# Patient Record
Sex: Male | Born: 1995 | Hispanic: No | Marital: Single
Health system: Western US, Academic
[De-identification: ages and names within clinical notes are randomized; demographics above are authoritative.]

## PROBLEM LIST (undated history)

## (undated) DIAGNOSIS — F319 Bipolar disorder, unspecified: Secondary | ICD-10-CM

## (undated) DIAGNOSIS — F609 Personality disorder, unspecified: Secondary | ICD-10-CM

---
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
## 2013-01-14 ENCOUNTER — Emergency Department: Payer: Self-pay | Admitting: Emergency Medicine

## 2013-02-06 ENCOUNTER — Emergency Department: Payer: Self-pay | Admitting: Emergency Medicine

## 2013-02-12 ENCOUNTER — Emergency Department: Payer: Self-pay | Admitting: Emergency Medicine

## 2013-03-20 ENCOUNTER — Emergency Department: Payer: Self-pay | Admitting: Emergency Medicine

## 2013-08-13 ENCOUNTER — Encounter (HOSPITAL_COMMUNITY): Payer: Self-pay | Admitting: Emergency Medicine

## 2013-08-13 ENCOUNTER — Emergency Department: Payer: Self-pay | Admitting: Emergency Medicine

## 2013-08-13 ENCOUNTER — Emergency Department (HOSPITAL_COMMUNITY)
Admission: EM | Admit: 2013-08-13 | Discharge: 2013-08-14 | Disposition: A | Discharge status: Home / Self Care | Payer: BC Managed Care – PPO | Attending: Emergency Medicine | Admitting: Emergency Medicine

## 2013-08-13 DIAGNOSIS — R55 Syncope and collapse: Secondary | ICD-10-CM | POA: Insufficient documentation

## 2013-08-13 DIAGNOSIS — F17200 Nicotine dependence, unspecified, uncomplicated: Secondary | ICD-10-CM | POA: Insufficient documentation

## 2013-08-13 DIAGNOSIS — F319 Bipolar disorder, unspecified: Secondary | ICD-10-CM | POA: Insufficient documentation

## 2013-08-13 DIAGNOSIS — R5383 Other fatigue: Secondary | ICD-10-CM

## 2013-08-13 DIAGNOSIS — R5381 Other malaise: Secondary | ICD-10-CM | POA: Insufficient documentation

## 2013-08-13 HISTORY — DX: Bipolar disorder, unspecified: F31.9

## 2013-08-13 HISTORY — DX: Personality disorder, unspecified: F60.9

## 2013-08-13 LAB — CBC
HCT: 47 % (ref 40.0–52.0)
HGB: 16.4 g/dL (ref 13.0–18.0)
MCH: 30.4 pg (ref 26.0–34.0)
MCHC: 34.8 g/dL (ref 32.0–36.0)
MCV: 87 fL (ref 80–100)
PLATELETS: 316 10*3/uL (ref 150–440)
RBC: 5.39 10*6/uL (ref 4.40–5.90)
RDW: 13.4 % (ref 11.5–14.5)
WBC: 10.4 10*3/uL (ref 3.8–10.6)

## 2013-08-13 LAB — ETHANOL
Alcohol, Ethyl (B): <11 mg/dL (ref 0–11)
Ethanol %: <0.003 % (ref 0.000–0.080)
Ethanol: <3 mg/dL

## 2013-08-13 LAB — DRUG SCREEN, URINE

## 2013-08-13 LAB — CBC WITH DIFFERENTIAL/PLATELET
Basophils Absolute: 0.1 10*3/uL (ref 0.0–0.1)
Basophils Relative: 1 % (ref 0–1)
Eosinophils Absolute: 0.1 10*3/uL (ref 0.0–0.7)
Eosinophils Relative: 1 % (ref 0–5)
HCT: 41 % (ref 39.0–52.0)
Hemoglobin: 14.6 g/dL (ref 13.0–17.0)
LYMPHS ABS: 1.5 10*3/uL (ref 0.7–4.0)
LYMPHS PCT: 13 % (ref 12–46)
MCH: 30.2 pg (ref 26.0–34.0)
MCHC: 35.6 g/dL (ref 30.0–36.0)
MCV: 84.9 fL (ref 78.0–100.0)
Monocytes Absolute: 1.1 10*3/uL — ABNORMAL HIGH (ref 0.1–1.0)
Monocytes Relative: 10 % (ref 3–12)
NEUTROS ABS: 8.4 10*3/uL — AB (ref 1.7–7.7)
Neutrophils Relative %: 76 % (ref 43–77)
PLATELETS: 281 10*3/uL (ref 150–400)
RBC: 4.83 MIL/uL (ref 4.22–5.81)
RDW: 12.6 % (ref 11.5–15.5)
WBC: 11.1 10*3/uL — AB (ref 4.0–10.5)

## 2013-08-13 LAB — RAPID URINE DRUG SCREEN, HOSP PERFORMED
Amphetamines: NOT DETECTED
BARBITURATES: NOT DETECTED
BENZODIAZEPINES: NOT DETECTED
Cocaine: NOT DETECTED
Opiates: NOT DETECTED
Tetrahydrocannabinol: NOT DETECTED

## 2013-08-13 LAB — COMPREHENSIVE METABOLIC PANEL
ALBUMIN: 4.3 g/dL (ref 3.8–5.6)
ALK PHOS: 71 U/L
AST: 30 U/L (ref 10–41)
Anion Gap: 7 (ref 7–16)
BILIRUBIN TOTAL: 0.6 mg/dL (ref 0.2–1.0)
BUN: 18 mg/dL (ref 7–18)
Calcium, Total: 9.3 mg/dL (ref 9.0–10.7)
Chloride: 106 mmol/L (ref 98–107)
Co2: 26 mmol/L (ref 21–32)
Creatinine: 0.94 mg/dL (ref 0.60–1.30)
EGFR (Non-African Amer.): >60
Glucose: 102 mg/dL — ABNORMAL HIGH (ref 65–99)
Osmolality: 280 (ref 275–301)
Potassium: 3.8 mmol/L (ref 3.5–5.1)
SGPT (ALT): 21 U/L (ref 12–78)
Sodium: 139 mmol/L (ref 136–145)
TOTAL PROTEIN: 7.8 g/dL (ref 6.4–8.6)

## 2013-08-13 LAB — URINALYSIS, COMPLETE
BACTERIA: NONE SEEN
BILIRUBIN, UR: NEGATIVE
Glucose,UR: NEGATIVE mg/dL (ref 0–75)
KETONE: NEGATIVE
NITRITE: NEGATIVE
Ph: 5 (ref 4.5–8.0)
Protein: NEGATIVE
RBC,UR: 3 /HPF (ref 0–5)
Specific Gravity: 1.023 (ref 1.003–1.030)

## 2013-08-13 LAB — BASIC METABOLIC PANEL
BUN: 18 mg/dL (ref 6–23)
CO2: 26 meq/L (ref 19–32)
Calcium: 9.2 mg/dL (ref 8.4–10.5)
Chloride: 100 mEq/L (ref 96–112)
Creatinine, Ser: 1.01 mg/dL (ref 0.50–1.35)
GFR calc Af Amer: >90 mL/min (ref >90)
GFR calc non Af Amer: >90 mL/min (ref >90)
GLUCOSE: 95 mg/dL (ref 70–99)
Potassium: 3.7 mEq/L (ref 3.7–5.3)
Sodium: 138 mEq/L (ref 137–147)

## 2013-08-13 LAB — TROPONIN I: Troponin-I: <0.02 ng/mL

## 2013-08-13 LAB — LIPASE, BLOOD: Lipase: 97 U/L (ref 73–393)

## 2013-08-13 NOTE — ED Notes (Addendum)
Joanie CoddingtonLatricia, RN given report

## 2013-08-13 NOTE — ED Provider Notes (Signed)
CSN: [id number]     Arrival date & time 08/13/13  1613 History   First MD Initiated Contact with Patient 08/13/13 2207     Chief Complaint  Patient presents with  . Medical Clearance   (Consider location/radiation/quality/duration/timing/severity/associated sxs/prior Treatment) HPI Comments: 18 year old male presents to the ER seeking medicines for his bipolar. The patient states that 2 years ago he stopped taking Seroquel and other unknown medicine that he is taking for bipolar and has anger issues. The patient states that he feels like he needs these medicines back because she's having a lot of family issues "a dark time". The patient states that he feels that this would help him calm down. The patient has not had any outbursts recently. He denies any SI or HI. The patient also recently came off alcohol and drugs 2 months ago. Patient states that he has a psychiatric clinic in his hometown that he feels like he can followup with tomorrow but wants to get started on some medicines. The patient recently was in the ER in [place] last night as he had a syncopal episode after smoking. The patient denies any chest pain or shortness of breath. He states he feels normal at this time. He states he was given IV fluids but is not otherwise known as results and states they did nothing for her and sent him home. He did not bring up the issues of his meds but the ER doctor at [facility]   Past Medical History  Diagnosis Date  . Bipolar 1 disorder   . Personality disorder    History reviewed. No pertinent past surgical history. No family history on file. History  Substance Use Topics  . Smoking status: Current Every Day Smoker -- 1.00 packs/day    Types: Cigarettes  . Smokeless tobacco: Not on file  . Alcohol Use: No    Review of Systems  Constitutional: Positive for fatigue.  Respiratory: Negative for cough and shortness of breath.   Cardiovascular: Negative for chest pain.  Gastrointestinal:  Negative for vomiting and abdominal pain.  Neurological: Positive for syncope. Negative for weakness.  Psychiatric/Behavioral: Negative for suicidal ideas, self-injury and dysphoric mood.  All other systems reviewed and are negative.    Allergies  Risperdal  Home Medications   Current Outpatient Rx  Name  Route  Sig  Dispense  Refill  . ibuprofen (ADVIL,MOTRIN) 200 MG tablet   Oral   Take 200 mg by mouth every 6 (six) hours as needed for mild pain.          BP 151/80  Pulse 77  Temp(Src) 97.6 F (36.4 C) (Oral)  Resp 18  SpO2 99% Physical Exam  Nursing note and vitals reviewed. Constitutional: He is oriented to person, place, and time. He appears well-developed and well-nourished.  HENT:  Head: Normocephalic and atraumatic.  Right Ear: External ear normal.  Left Ear: External ear normal.  Nose: Nose normal.  Eyes: Right eye exhibits no discharge. Left eye exhibits no discharge.  Neck: Neck supple.  Cardiovascular: Normal rate, regular rhythm, normal heart sounds and intact distal pulses.   No murmur heard. Pulmonary/Chest: Effort normal.  Abdominal: Soft. He exhibits no distension. There is no tenderness.  Musculoskeletal: He exhibits no edema.  Neurological: He is alert and oriented to person, place, and time.  Skin: Skin is warm and dry.    ED Course  Procedures (including critical care time) Labs Review Labs Reviewed  CBC WITH DIFFERENTIAL - Abnormal; Notable for the following:  WBC 11.1 (*)    Neutro Abs 8.4 (*)    Monocytes Absolute 1.1 (*)    All other components within normal limits  BASIC METABOLIC PANEL  URINE RAPID DRUG SCREEN (HOSP PERFORMED)  ETHANOL   Imaging Review No results found.  EKG Interpretation   None       MDM   1. Bipolar 1 disorder    Patient with psych complaints but no acute indication for admission (no SI/HI or psychosis). Psych consulted for med recs. As for his syncope, tried to get records from [place] but  unable. No complaints today related to syncope, feel he can f/u as outpatient for this. Care transferred with psych recs pending.     Audree Camel, MD 08/14/13 [phone]

## 2013-08-13 NOTE — ED Notes (Signed)
Aunt called stating pt having flight of thoughts; has had several anger outbursts recently; moved in with aunt in November 2014 to avoid him having to go to jail; states are small children in the home that are scared of the patient

## 2013-08-13 NOTE — ED Notes (Signed)
Family member Jearld LeschKatherine Lolling, [phone], reports pt is out on bond, stating if he is released the Police Dept needs to be notified.

## 2013-08-13 NOTE — ED Notes (Signed)
Pt presents from triage, transferred from medical clearance.  Pt experiencing anger issues at home.  Pt states he became so angry he couldn't cope or contain his anger.  Pt states he wants to resume his meds, has been off for past 2 years.  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  Pt reports he has a history of Bipolar, does not remember the names of meds he was taking.   Denies SI, HI or AV hallucinations.  Pt calm & cooperative, interactive, pleasant at present.

## 2013-08-13 NOTE — Progress Notes (Signed)
   CARE MANAGEMENT ED NOTE 08/13/2013  Patient:  James Austin,James Austin   Account Number:  [id number]  Date Initiated:  08/13/2013  Documentation initiated by:  Radford PaxFERRERO,Saif Peter  Subjective/Objective Assessment:   Patient presents to Ed with issues at home and could not control his anger     Subjective/Objective Assessment Detail:   Patient was seen at [facility] with same.     Action/Plan:   Action/Plan Detail:   Anticipated DC Date:       Status Recommendation to Physician:   Result of Recommendation:    Other ED Services  Consult Working Plan    DC Planning Services  Other  PCP issues    Choice offered to / List presented to:            Status of service:  Completed, signed off  ED Comments:   ED Comments Detail:  EDCM spoke to patient at bedside.  Patient reports he does not have a pcp but does have [employer] insurance.  EDCM provided patient with list of pcps who accept BC[employer] insurance within a ten mile radius of patient's zip code.  Patient thankful for resources.

## 2013-08-13 NOTE — ED Notes (Signed)
Pt states is bipolar and has trouble with anger issues; was seen at [facility] last night for same; states wants to be put back on meds; off meds x 2 years; last couple of months has felt worse with incidents with a young child in the home

## 2013-08-13 NOTE — ED Notes (Signed)
Pt;s dad took belonging with him

## 2013-08-14 DIAGNOSIS — F319 Bipolar disorder, unspecified: Secondary | ICD-10-CM

## 2013-08-14 MED ORDER — QUETIAPINE FUMARATE 50 MG PO TABS: 50.0000 mg | ORAL_TABLET | Freq: Every day | ORAL | Status: AC

## 2013-08-14 MED ORDER — BUSPIRONE HCL 10 MG PO TABS: 10.0000 mg | ORAL_TABLET | Freq: Two times a day (BID) | ORAL | Status: AC

## 2013-08-14 NOTE — Discharge Instructions (Signed)
°Emergency Department Resource Guide °1) Find a Doctor and Pay Out of Pocket °Although you won't have to find out who is covered by your insurance plan, it is a good idea to ask around and get recommendations. You will then need to call the office and see if the doctor you have chosen will accept you as a new patient and what types of options they offer for patients who are self-pay. Some doctors offer discounts or will set up payment plans for their patients who do not have insurance, but you will need to ask so you aren't surprised when you get to your appointment. ° °2) Contact Your Local Health Department °Not all health departments have doctors that can see patients for sick visits, but many do, so it is worth a call to see if yours does. If you don't know where your local health department is, you can check in your phone book. The CDC also has a tool to help you locate your state's health department, and many state websites also have listings of all of their local health departments. ° °3) Find a Walk-in Clinic °If your illness is not likely to be very severe or complicated, you may want to try a walk in clinic. These are popping up all over the country in pharmacies, drugstores, and shopping centers. They're usually staffed by nurse practitioners or physician assistants that have been trained to treat common illnesses and complaints. They're usually fairly quick and inexpensive. However, if you have serious medical issues or chronic medical problems, these are probably not your best option. ° °No Primary Care Doctor: °- Call Health Connect at  832-8000 - they can help you locate a primary care doctor that  accepts your insurance, provides certain services, etc. °- Physician Referral Service- 1-800-533-3463 ° °Chronic Pain Problems: °Organization         Address  Phone   Notes  °[facility]  (336) 297-2271 Patients need to be referred by their primary care doctor.  ° °Medication  Assistance: °Organization         Address  Phone   Notes  °[place] [facility] [address] °[place], [place] [address] (336) 641-8030 --Must be a resident of [place] °-- Must have NO insurance coverage whatsoever (no Medicaid/ Medicare, etc.) °-- The pt. MUST have a primary care doctor that directs their care regularly and follows them in the community °  °[facility]  (866) 331-1348   °[employer]  (888) 892-1162   ° °Agencies that provide inexpensive medical care: °Organization         Address  Phone   Notes  °[facility]  (336) 832-8035   °[facility]    (336) 832-7272   °[facility] [address] °[place], [place] [address] (336) 832-4777   °[facility] [address], °[place] (336) 271-4999   °[facility]    (336) 373-0678   °[facility]    (336) 272-1050   °[facility] ° [address], [place] Phone:  (336) 832-4444, Fax:  (336) 832-4440 Hours of Operation:  9 am - 6 pm, M-F.  Also accepts Medicaid/Medicare and self-pay.  °[facility] ° [address], [place] Phone: (336) 832-3150, Fax: (336) 832-3151. Hours of Operation:  8:30 am - 5:30 pm, M-F.  Also accepts Medicaid and self-pay.  °[facility] [place] [address], [place] Phone: (336) 878-6027   °[facility] [address], [place], [place] (336)723-1848, Ext. 123 Mondays & Thursdays: 7-9 AM.  First 15 patients are seen on a first come, first serve basis. °  ° °Medicaid-accepting [place] Providers: ° °Organization         Address  Phone   Notes  °[facility] [address], [place] (336) 641-2100 Also accepts self-pay patients.  °[facility] [address], [place] ° (336) 856-9996   °[facility] [address], [place]  (336) 288-8857   °[facility] [address] [place] [address], [place] (336) 299-7000   °Veita Bland [address], [place]  ° (336) 373-1557 Only accepts [place] Access Medicaid patients after they have their name applied to their card.  ° °Self-Pay (no insurance) in [place]: ° °Organization         Address  Phone   Notes  °Sickle Cell Patients, [facility] [address], [place] (336) 832-1970   °[facility] [address], [place] (336) 832-4400   ° [facility] ° [address], [place] (336) 992-4800   °[facility]/Dr. Osei-Bonsu ° [address] [place] [address], [place] or [address], [place] (336) 841-8500 Phone number for both [place] and [place] locations is the same.  °[facility] [address], [place] (336) 299-0000   °[facility] [address] [place] [address], [place] or [address] (336) 852-7530 °(336) 878-2260   °[facility] [address], [place] (336) 350-1642, phone; (336) 294-5005, fax Sees patients 1st and 3rd Saturday of every month.  Must not qualify for public or private insurance (i.e. Medicaid, Medicare, [place] Health Choice, Veterans' Benefits) • Household income should be no more than 200% of the poverty level •The clinic cannot treat you if you are pregnant or think you are pregnant • Sexually transmitted diseases are not treated at the clinic.  ° ° °Dental Care: °Organization         Address  Phone  Notes  °[place] [facility] [address], [place] (336) 641-6152 Accepts children up to age 21 who are enrolled in Medicaid or [place] Health Choice; pregnant women with a Medicaid card; and children who have applied for Medicaid or [place] Health Choice, but were declined, whose parents can pay a reduced fee at time of service.  °[place]  [facility] [place]  [address], [place] (336) 641-7733 Accepts children up to age 21 who are enrolled in Medicaid or [place] Health Choice; pregnant women with a Medicaid card; and children who have applied for Medicaid or [place] Health Choice, but were declined, whose parents can pay a reduced fee at time of service.  °[facility] ° [address], [place] (336) 641-4533 Patients are seen by appointment only. Walk-ins are not accepted. [facility] will see patients 18 years of age and older. °Monday - Tuesday (8am-5pm) °Most Wednesdays (8:30-5pm) °$30 per visit, cash only  °[facility] ° [address], [place] (336) 641-4533 Patients are seen by appointment only. Walk-ins are not accepted. [facility] will see patients 18 years of age and older. °One   Wednesday Evening (Monthly: Volunteer Based).  $30 per visit, cash only  °[facility]  (919) 537-3737 for adults; Children under age 4, call Graduate Pediatric Dentistry at (919) [phone]. Children aged 4-14, please call (919) 537-3737 to request a pediatric application. ° Dental services are provided in all areas of dental care including fillings, crowns and bridges, complete and partial dentures, implants, gum treatment, root canals, and extractions. Preventive care is also provided. Treatment is provided to both adults and children. °Patients are selected via a lottery and there is often a waiting list. °  °[facility] [address], °[place] ° (336) 763-8833 [email or web address] °  °[facility] [address], [place], [place] (336)723-1848, Ext. 123 Second and Fourth Thursday of each month, opens at 6:30 AM; Clinic ends at 9 AM.  Patients are seen on a first-come first-served basis, and a limited number are seen during each clinic.  ° °[facility] ° [address], [place], [place] (336) 723-7904    Eligibility Requirements °You must have lived in [place], [place], or [place] counties for at least the last three months. °  You cannot be eligible for state or federal sponsored healthcare insurance, including Veterans Administration, Medicaid, or Medicare. °  You generally cannot be eligible for healthcare insurance through your employer.  °  How to apply: °Eligibility screenings are held every Tuesday and Wednesday afternoon from 1:00 pm until 4:00 pm. You do not need an appointment for the interview!  °[facility] [address], [place], [place] 336-631-2330   °[place] [facility]  336-342-8273   °[place]  336-703-3100   °[facility]  336-570-6415   ° °Behavioral Health Resources in the Community: °Intensive Outpatient Programs °Organization         Address  Phone  Notes  °[facility] [address], [place], [place] 336-878-6098   °[facility] [address], [place], [place] 336-832-9800   °[facility] [address], [place], [place] ° 336-882-2125   °[facility] [address],  °[place], [place] 1-800-853-5163 or 336-641-4981   °Substance Abuse Resources °Organization         Address  Phone  Notes  °[facility]  336-882-2125   °[facility]  336-784-9470   °[facility]  336-285-9073   °[facility]  336-845-3988   °Residential & Outpatient Substance Abuse Program  1-800-659-3381   °Psychological Services °Organization         Address  Phone  Notes  °[facility]  336- 832-9600   °[facility]  336- 378-7881   °[facility] [address], [place] 1-800-853-5163 or 336-641-4981   ° °Mobile Crisis Teams °Organization         Address  Phone  Notes  °[facility], Mobile Crisis Care Unit  1-877-626-1772   °Assertive °[facility] ° [address]  [place], [place] 336-834-9664   °Sharon DeEsch [address] °[place] 336-554-5454   ° °Self-Help/Support Groups °Organization         Address  Phone             Notes  °[facility] - variety of support groups  336- 373-1402 Call for more information  °Narcotics Anonymous (NA), [facility] [address], °[place]  2 meetings at this location  ° °  Residential Treatment Programs °Organization         Address  Phone  Notes  °[facility] [address],    °[place]  1-866-801-8205   °[facility] ° [address], [place], [place] 704-293-8524   °[facility] [address], [place] 336-845-3988 Admissions: 8am-3pm M-F  °[facility] [address]    °[place],  [place] 336-841-1104   °[facility] [address], [place], [place] 336-379-7146   °[facility] [address]  °[place], [place] 336-285-9073   °[facility] - Intensive Outpatient [address], [place], [place] 336-852-3033   °[facility] [address]  °[place], [place] 1-877-615-2722 or 336-784-9470   °[facility] [address] [place], [place] 336-227-7417 Accepts Medicaid  °[facility] [address]  °[place] 1-800-659-3381 Substance Abuse/Addiction Treatment  ° °[place] Behavioral Health Resources °Organization         Address  Phone  Notes  °[facility]  (888) 581-9988   °Julie Brannon, PhD [address], [place]   (336) 349-5553 or (336) 951-0000   °[facility]   [address] °[place], [place] (336) 349-4454   °[facility] [address], [place], [place] (336) 342-8316 Insurance/Medicaid/sponsorship through [facility]  °[facility] [address]                                    [place], [place] (336) 342-8316 Therapy/tele-psych/case    °[facility] [address]  ° [place], [place] (336) 349-2233    °Dr. Arfeen  (336) 349-4544   °[facility] [place]  [employer] [place] [facility] 1) [address],  °2) [address], [place] °3)  [address], [place] (336) 349-3220 °(336) 342-7768 ° °(336) 342-8140   °[place] Child Abuse Hotline (336) 342-1394 or (336) 342-3537 (After Hours)    ° ° °

## 2013-08-14 NOTE — Consult Note (Signed)
[facility] Face-to-Face Psychiatry Consult   Reason for Consult:  Trouble controlling his temper and off his Bipolar medications Referring Physician:  ER MD  James Austin is an 18 y.o. male.  Assessment: AXIS I:  Bipolar, Depressed AXIS II:  Deferred AXIS III:   Past Medical History  Diagnosis Date  . Bipolar 1 disorder   . Personality disorder    AXIS IV:  economic problems and other psychosocial or environmental problems AXIS V:  61-70 mild symptoms  Plan:  No evidence of imminent risk to self or others at present.    Subjective:   James Austin is a 18 y.o. male patient admitted with temper outbursts.  HPI:  James Austin says he moved here from [place] a year ago and has been off his bipolar medications since.  He has had trouble with mood swings mostly anger control issues.  He has gotten into trouble with the law on reportedly minor charges and is on bail.  He denies any suicidal or homicidal thoughts.  He gets irritated easily and is more depressed than anxious.  I do not know of any mania but will leave his diagnosis unchanged for now.  He lives with his aunt who is supportive.  He does not see his own parents as supportive and his father is currently in prison.When he takes the medications prescribed by his doctor in [place] he does okay, he says. HPI Elements:   Location:  depression and anger outbursts. Quality:  interfering with his relationships . Severity:  moderate. Timing:  recent trouble with the law  and the news that his father was being sent to jail. Duration:  several years at least. Context:  see "timing".  Past Psychiatric History: Past Medical History  Diagnosis Date  . Bipolar 1 disorder   . Personality disorder     reports that he has been smoking Cigarettes.  He has been smoking about 1.00 pack per day. He does not have any smokeless tobacco history on file. He reports that he does not drink alcohol. His drug history is not on file. No family history on file.         Allergies:   Allergies  Allergen Reactions  . Risperdal [Risperidone]     ACT Assessment Complete:  Yes:    Educational Status    Risk to Self: Risk to self Is patient at risk for suicide?: No Substance abuse history and/or treatment for substance abuse?: Yes  Risk to Others:    Abuse:    Prior Inpatient Therapy:    Prior Outpatient Therapy:    Additional Information:                    Objective: Blood pressure 114/68, pulse 93, temperature 97.3 F (36.3 C), temperature source Oral, resp. rate 20, SpO2 99.00%.There is no height or weight on file to calculate BMI. Results for orders placed during the hospital encounter of 08/13/13 (from the past 72 hour(s))  CBC WITH DIFFERENTIAL     Status: Abnormal   Collection Time    08/13/13  5:03 PM      Result Value Range   WBC 11.1 (*) 4.0 - 10.5 K/uL   RBC 4.83  4.22 - 5.81 MIL/uL   Hemoglobin 14.6  13.0 - 17.0 g/dL   HCT 41.0  39.0 - 52.0 %   MCV 84.9  78.0 - 100.0 fL   MCH 30.2  26.0 - 34.0 pg   MCHC 35.6  30.0 - 36.0 g/dL  RDW 12.6  11.5 - 15.5 %   Platelets 281  150 - 400 K/uL   Neutrophils Relative % 76  43 - 77 %   Neutro Abs 8.4 (*) 1.7 - 7.7 K/uL   Lymphocytes Relative 13  12 - 46 %   Lymphs Abs 1.5  0.7 - 4.0 K/uL   Monocytes Relative 10  3 - 12 %   Monocytes Absolute 1.1 (*) 0.1 - 1.0 K/uL   Eosinophils Relative 1  0 - 5 %   Eosinophils Absolute 0.1  0.0 - 0.7 K/uL   Basophils Relative 1  0 - 1 %   Basophils Absolute 0.1  0.0 - 0.1 K/uL  BASIC METABOLIC PANEL     Status: None   Collection Time    08/13/13  5:03 PM      Result Value Range   Sodium 138  137 - 147 mEq/L   Potassium 3.7  3.7 - 5.3 mEq/L   Chloride 100  96 - 112 mEq/L   CO2 26  19 - 32 mEq/L   Glucose, Bld 95  70 - 99 mg/dL   BUN 18  6 - 23 mg/dL   Creatinine, Ser 1.01  0.50 - 1.35 mg/dL   Calcium 9.2  8.4 - 10.5 mg/dL   GFR calc non Af Amer >90  >90 mL/min   GFR calc Af Amer >90  >90 mL/min   Comment: (NOTE)     The eGFR  has been calculated using the CKD EPI equation.     This calculation has not been validated in all clinical situations.     eGFR's persistently <90 mL/min signify possible Chronic Kidney     Disease.  ETHANOL     Status: None   Collection Time    08/13/13  5:03 PM      Result Value Range   Alcohol, Ethyl (B) <11  0 - 11 mg/dL   Comment:            LOWEST DETECTABLE LIMIT FOR     SERUM ALCOHOL IS 11 mg/dL     FOR MEDICAL PURPOSES ONLY  URINE RAPID DRUG SCREEN (HOSP PERFORMED)     Status: None   Collection Time    08/13/13  9:31 PM      Result Value Range   Opiates NONE DETECTED  NONE DETECTED   Cocaine NONE DETECTED  NONE DETECTED   Benzodiazepines NONE DETECTED  NONE DETECTED   Amphetamines NONE DETECTED  NONE DETECTED   Tetrahydrocannabinol NONE DETECTED  NONE DETECTED   Barbiturates NONE DETECTED  NONE DETECTED   Comment:            DRUG SCREEN FOR MEDICAL PURPOSES     ONLY.  IF CONFIRMATION IS NEEDED     FOR ANY PURPOSE, NOTIFY LAB     WITHIN 5 DAYS.                LOWEST DETECTABLE LIMITS     FOR URINE DRUG SCREEN     Drug Class       Cutoff (ng/mL)     Amphetamine      1000     Barbiturate      200     Benzodiazepine   657     Tricyclics       903     Opiates          300     Cocaine  300     THC              50   Labs are reviewed and are pertinent for no psychiatric issues.  No current facility-administered medications for this encounter.   Current Outpatient Prescriptions  Medication Sig Dispense Refill  . ibuprofen (ADVIL,MOTRIN) 200 MG tablet Take 200 mg by mouth every 6 (six) hours as needed for mild pain.        Psychiatric Specialty Exam:     Blood pressure 114/68, pulse 93, temperature 97.3 F (36.3 C), temperature source Oral, resp. rate 20, SpO2 99.00%.There is no height or weight on file to calculate BMI.  General Appearance: Well Groomed  Engineer, water::  Good  Speech:  Clear and Coherent  Volume:  Normal  Mood:  Anxious  Affect:   Appropriate  Thought Process:  Coherent and Goal Directed  Orientation:  Full (Time, Place, and Person)  Thought Content:  Negative  Suicidal Thoughts:  No  Homicidal Thoughts:  No  Memory:  Immediate;   Good Recent;   Good Remote;   Good  Judgement:  Intact  Insight:  Fair  Psychomotor Activity:  Normal  Concentration:  Good  Recall:  Good  Akathisia:  Negative  Handed:  Right  AIMS (if indicated):     Assets:  Communication Skills Desire for Improvement Housing Physical Health Social Support  Sleep:      Treatment Plan Summary: will write prescriptions for Seroquel 50 mg hs and buspirone 10 mg bid and discharge home to be followed outpatient  Tyaire Odem D 08/14/2013 11:36 AM

## 2013-08-14 NOTE — [facility] Suicide Risk Assessment (Signed)
Suicide Risk Assessment  Discharge Assessment     Demographic Factors:  Male, Adolescent or young adult, Caucasian and Low socioeconomic status  Mental Status Per Nursing Assessment::   On Admission:     Current Mental Status by Physician: NA  Loss Factors: NA  Historical Factors: NA  Risk Reduction Factors:   Sense of responsibility to family, Living with another person, especially a relative and Positive social support  Continued Clinical Symptoms:  Depression:   Impulsivity  Cognitive Features That Contribute To Risk:  none    Suicide Risk:  Minimal: No identifiable suicidal ideation.  Patients presenting with no risk factors but with morbid ruminations; may be classified as minimal risk based on the severity of the depressive symptoms  Discharge Diagnoses:   AXIS I:  Bipolar, Depressed AXIS II:  Deferred AXIS III:   Past Medical History  Diagnosis Date  . Bipolar 1 disorder   . Personality disorder    AXIS IV:  economic problems, educational problems and other psychosocial or environmental problems AXIS V:  61-70 mild symptoms  Plan Of Care/Follow-up recommendations:  Activity:  no restrictions Diet:  no restrictions  Is patient on multiple antipsychotic therapies at discharge:  NA  Has Patient had three or more failed trials of antipsychotic monotherapy by history:  No  Recommended Plan for Multiple Antipsychotic Therapies: NA  TAYLOR,GERALD D 08/14/2013, 11:54 AM

## 2013-08-14 NOTE — BH Assessment (Signed)
Discharge home per Dr. Ladona Ridgelaylor, list of community mental health referrals provided to this patient.

## 2013-08-14 NOTE — ED Notes (Signed)
Pt.'s family to bring him some clothes and drive him home, per pt.'s Aunt.

## 2013-08-14 NOTE — ED Notes (Signed)
Per pt.'s aunt, [facility]

## 2013-08-14 NOTE — Consult Note (Signed)
  Review of Systems  Constitutional: Negative.   HENT: Negative.   Eyes: Negative.   Respiratory: Negative.   Cardiovascular: Negative.   Gastrointestinal: Negative.   Genitourinary: Negative.   Musculoskeletal: Negative.   Skin: Negative.   Neurological: Negative.   Endo/Heme/Allergies: Negative.   Psychiatric/Behavioral: Positive for depression.   [name] says except for where " blood was drawn 3 times" he is fine with no aches or pains

## 2015-05-17 IMAGING — CT CT HEAD WITHOUT CONTRAST
1 series · 16 of 30 positions shown, 20 images · non-contrast
Comparison: None

CLINICAL DATA: Syncope, bipolar

EXAM:
CT HEAD WITHOUT CONTRAST
TECHNIQUE: Contiguous axial images were obtained from the base of the skull
through the vertex without contrast.

[Series 2: head wo · axial · 0.46mm/px · z∈[-123,+3]mm · 16 of 32 slices shown, 20 images]
[im 2/32  brain]
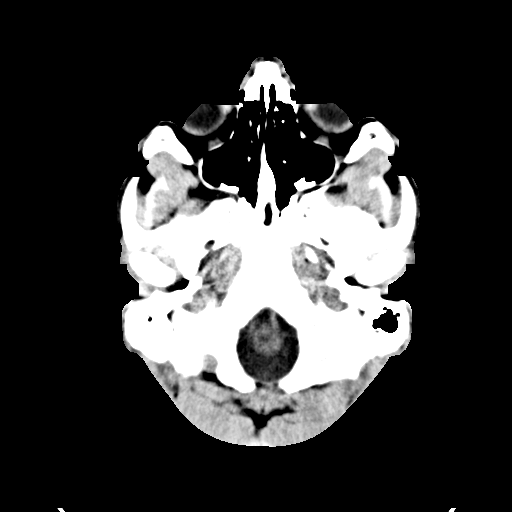
[im 2/32  bone]
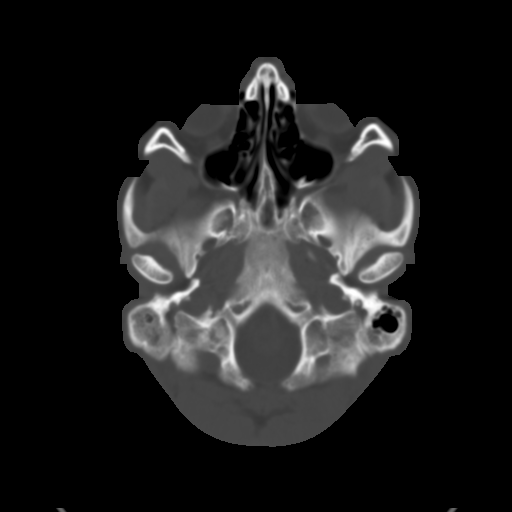
[im 4/32  brain]
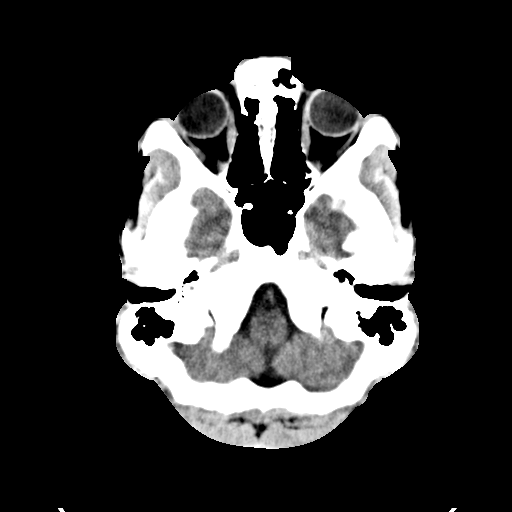
[im 6/32  brain]
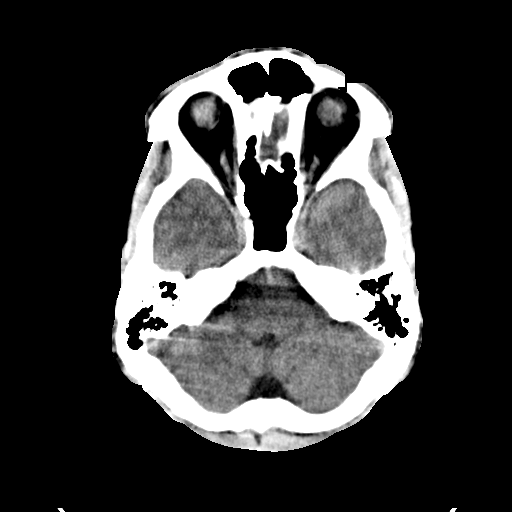
[im 8/32  brain]
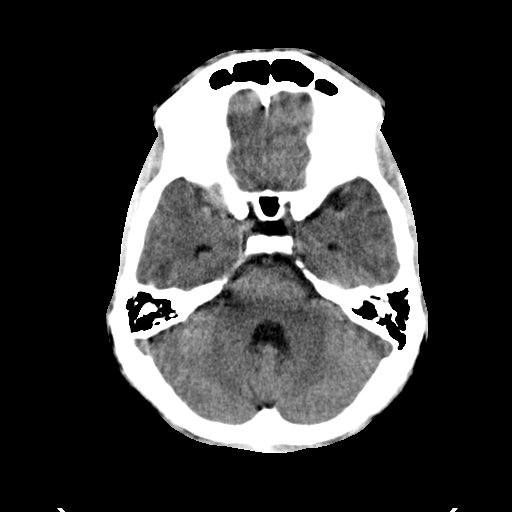
[im 9/32  brain]
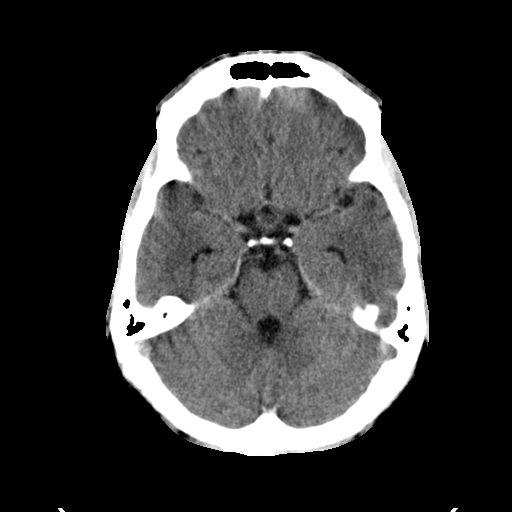
[im 9/32  bone]
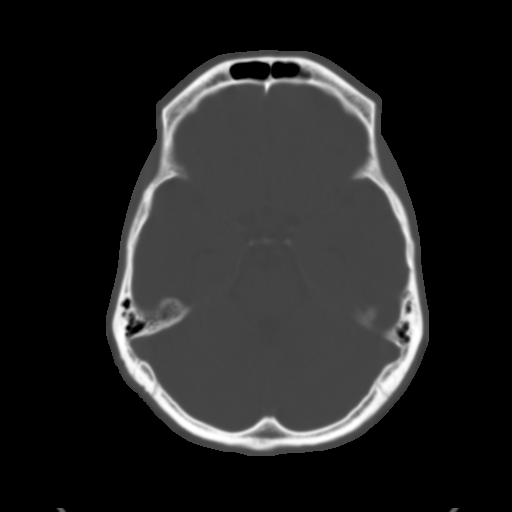
[im 11/32  brain]
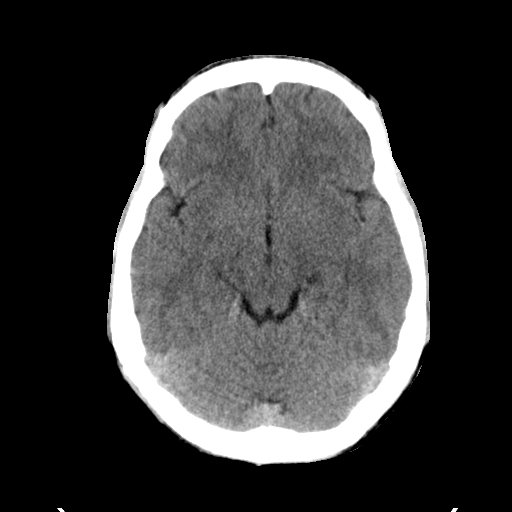
[im 13/32  brain]
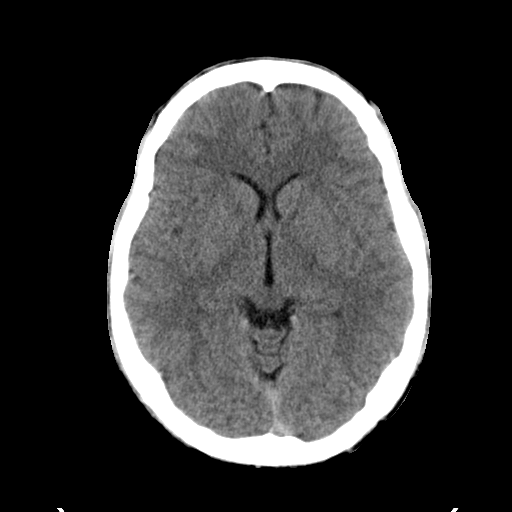
[im 15/32  brain]
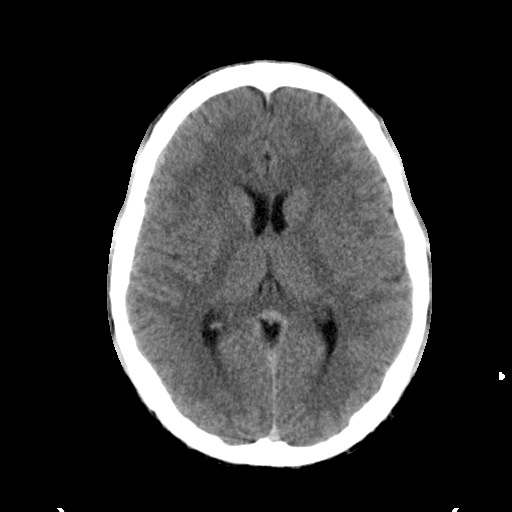
[im 17/32  brain]
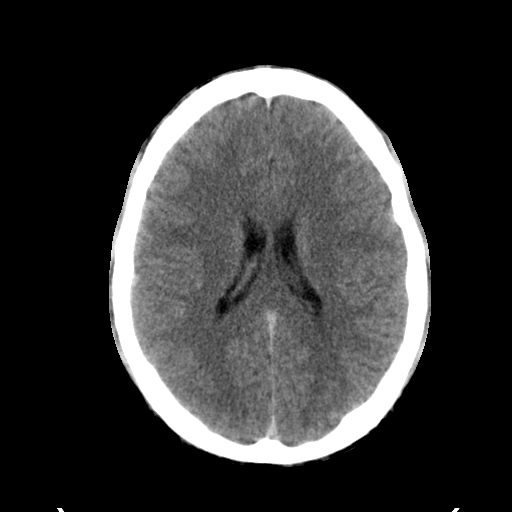
[im 17/32  bone]
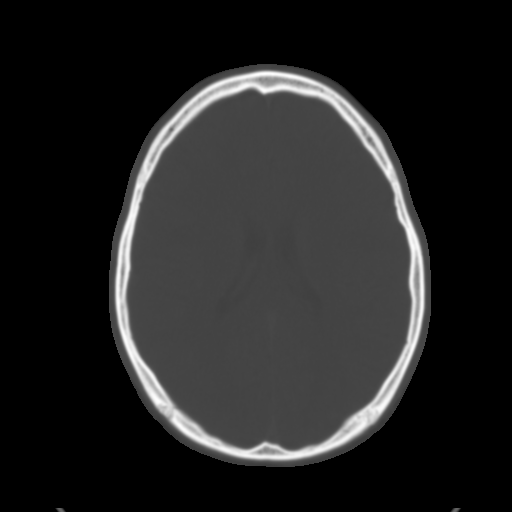
[im 19/32  brain]
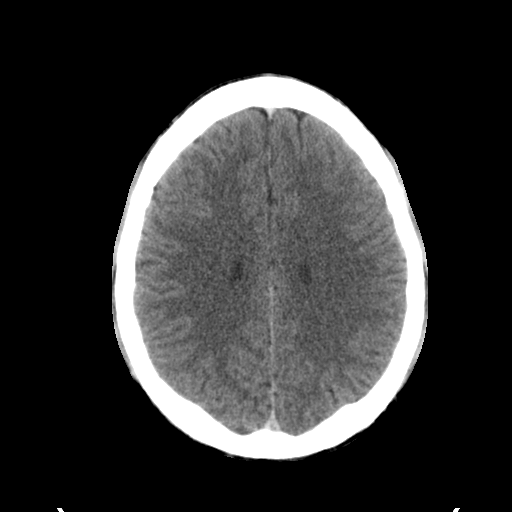
[im 21/32  brain]
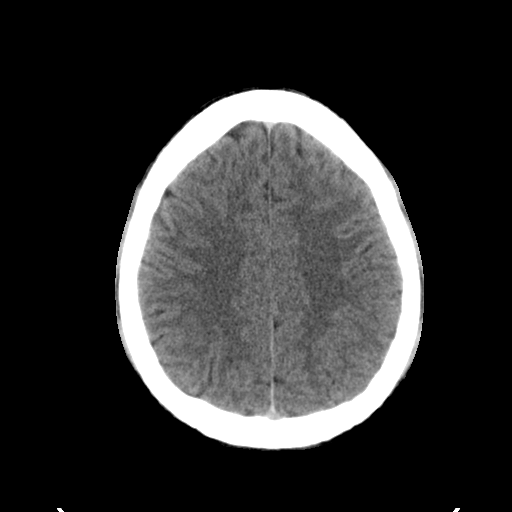
[im 23/32  brain]
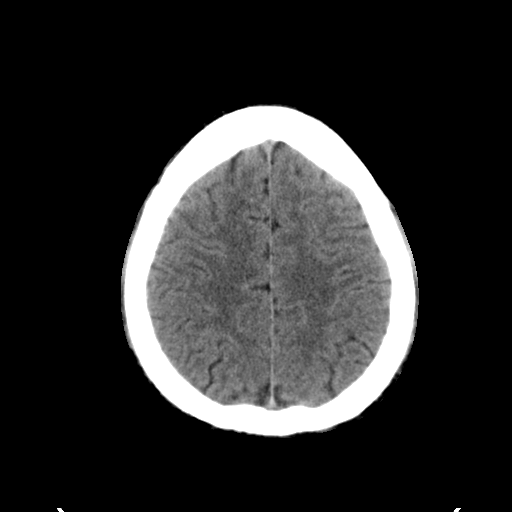
[im 24/32  brain]
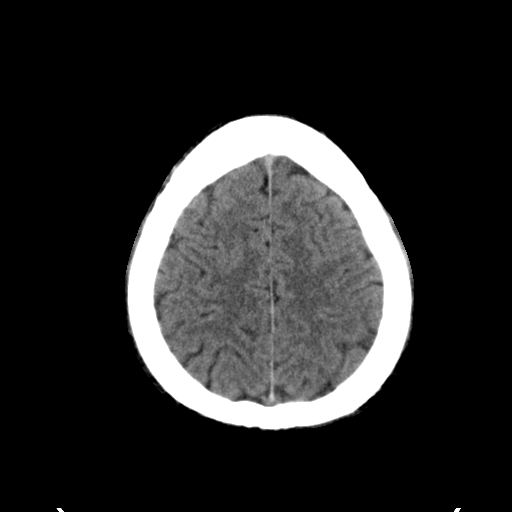
[im 24/32  bone]
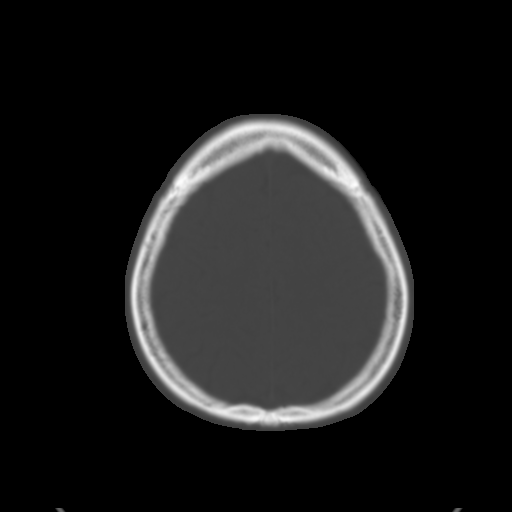
[im 26/32  brain]
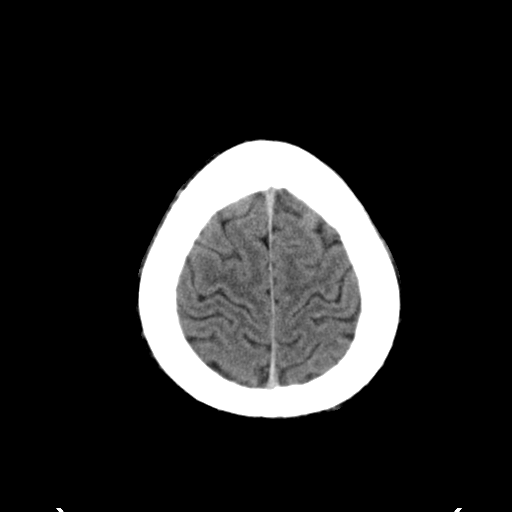
[im 28/32  brain]
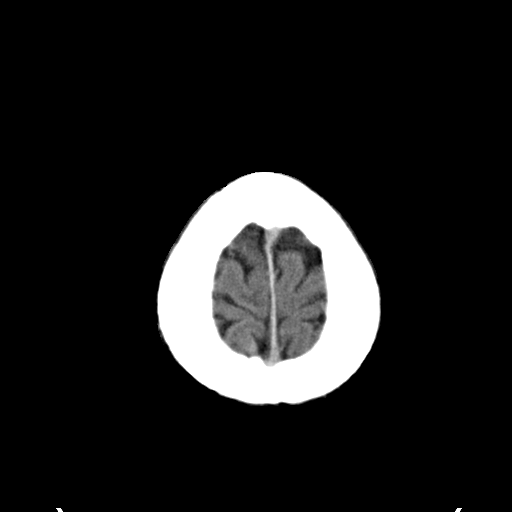
[im 30/32  brain]
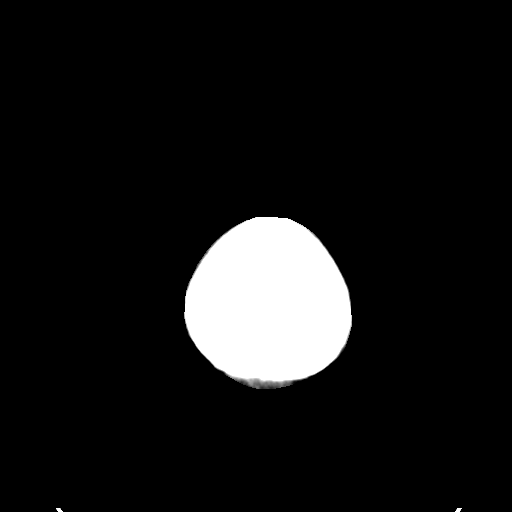

[16 of 30 positions shown; findings below may reference images not displayed]

FINDINGS: Normal appearance of the intracranial structures. No evidence for
acute hemorrhage, mass lesion, midline shift, hydrocephalus or large
infarct. No acute bony abnormality. The visualized sinuses are
clear.
IMPRESSION: No acute intracranial abnormality.

## 2022-03-05 ENCOUNTER — Encounter: Payer: Self-pay | Admitting: Registered Nurse

## 2022-03-05 DIAGNOSIS — H44709 Unspecified retained (old) intraocular foreign body, nonmagnetic, unspecified eye: Secondary | ICD-10-CM

## 2022-03-08 ENCOUNTER — Telehealth: Payer: Self-pay | Admitting: Registered Nurse
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
## 2022-04-19 ENCOUNTER — Ambulatory Visit (HOSPITAL_BASED_OUTPATIENT_CLINIC_OR_DEPARTMENT_OTHER): Payer: Medicaid Other | Admitting: Physician Assistant
